# Patient Record
Sex: Female | Born: 1950 | Race: White | Hispanic: No | Marital: Single
Health system: Southern US, Community
[De-identification: ages and names within clinical notes are randomized; demographics above are authoritative.]

---

## 2005-07-08 ENCOUNTER — Encounter: Admission: RE | Admit: 2005-07-08 | Discharge: 2005-07-08 | Payer: Self-pay | Admitting: Internal Medicine

## 2008-02-24 ENCOUNTER — Encounter: Admission: RE | Admit: 2008-02-24 | Discharge: 2008-02-24 | Payer: Self-pay | Admitting: Internal Medicine

## 2009-02-26 ENCOUNTER — Encounter: Admission: RE | Admit: 2009-02-26 | Discharge: 2009-02-26 | Payer: Self-pay | Admitting: Internal Medicine

## 2010-06-25 ENCOUNTER — Ambulatory Visit (HOSPITAL_COMMUNITY): Payer: Self-pay

## 2010-06-27 ENCOUNTER — Ambulatory Visit (HOSPITAL_COMMUNITY): Payer: Self-pay

## 2010-08-05 ENCOUNTER — Other Ambulatory Visit: Payer: Self-pay | Admitting: Surgery

## 2010-08-05 DIAGNOSIS — Z1231 Encounter for screening mammogram for malignant neoplasm of breast: Secondary | ICD-10-CM

## 2010-08-13 ENCOUNTER — Ambulatory Visit
Admission: RE | Admit: 2010-08-13 | Discharge: 2010-08-13 | Disposition: A | Discharge status: Home / Self Care | Payer: BC Managed Care – PPO | Source: Ambulatory Visit | Attending: Surgery | Admitting: Surgery

## 2010-08-13 DIAGNOSIS — Z1231 Encounter for screening mammogram for malignant neoplasm of breast: Secondary | ICD-10-CM

## 2011-10-13 ENCOUNTER — Other Ambulatory Visit: Payer: Self-pay | Admitting: Internal Medicine

## 2011-10-13 DIAGNOSIS — Z1231 Encounter for screening mammogram for malignant neoplasm of breast: Secondary | ICD-10-CM

## 2011-10-30 ENCOUNTER — Ambulatory Visit
Admission: RE | Admit: 2011-10-30 | Discharge: 2011-10-30 | Disposition: A | Discharge status: Home / Self Care | Payer: BC Managed Care – PPO | Source: Ambulatory Visit | Attending: Internal Medicine | Admitting: Internal Medicine

## 2011-10-30 DIAGNOSIS — Z1231 Encounter for screening mammogram for malignant neoplasm of breast: Secondary | ICD-10-CM

## 2013-02-16 ENCOUNTER — Other Ambulatory Visit: Payer: Self-pay

## 2013-02-16 DIAGNOSIS — Z1231 Encounter for screening mammogram for malignant neoplasm of breast: Secondary | ICD-10-CM

## 2013-03-17 ENCOUNTER — Ambulatory Visit
Admission: RE | Admit: 2013-03-17 | Discharge: 2013-03-17 | Disposition: A | Discharge status: Home / Self Care | Payer: Medicare (Managed Care) | Source: Ambulatory Visit

## 2013-03-17 DIAGNOSIS — Z1231 Encounter for screening mammogram for malignant neoplasm of breast: Secondary | ICD-10-CM

## 2013-09-15 ENCOUNTER — Encounter: Payer: Self-pay | Admitting: Cardiology

## 2013-09-16 ENCOUNTER — Encounter: Payer: Self-pay | Admitting: Cardiology

## 2013-10-06 ENCOUNTER — Telehealth: Payer: Self-pay | Admitting: *Deleted

## 2013-10-06 NOTE — Telephone Encounter (Signed)
Left message for pt to call back to discuss scheduling of right and left heart cath for cardiomyopathy per her PCP Dr August Saucer discussing with Dr Antoine Poche.

## 2013-10-06 NOTE — Telephone Encounter (Signed)
Follow up      Pt returning your call. Pt is going to have labs done and will be back home around 3 pm please call her cell if need be.

## 2013-10-07 ENCOUNTER — Encounter (HOSPITAL_COMMUNITY): Payer: Self-pay

## 2013-10-07 ENCOUNTER — Encounter: Payer: Self-pay | Admitting: *Deleted

## 2013-10-07 NOTE — Telephone Encounter (Signed)
Pt has been scheduled for a cardiac cath on Tuesday June 2.  Dr Antoine Poche aware. I reviewed all instructions with Natalie Richmond on the phone and emailed them to Natalie Richmond as well.  Per Dr Antoine Poche she is to remain off coumadin until after Natalie Richmond cath.  She is aware and will call back if any questions or concerns prior to the procedure.

## 2013-10-07 NOTE — Telephone Encounter (Signed)
Follow up        Pt is returning nurses call

## 2013-10-11 ENCOUNTER — Telehealth: Payer: Self-pay | Admitting: Cardiology

## 2013-10-11 ENCOUNTER — Ambulatory Visit (HOSPITAL_COMMUNITY)
Admission: RE | Admit: 2013-10-11 | Payer: Medicare (Managed Care) | Source: Ambulatory Visit | Admitting: Cardiovascular Disease

## 2013-10-11 ENCOUNTER — Encounter (HOSPITAL_COMMUNITY): Admission: RE | Payer: Self-pay | Source: Ambulatory Visit

## 2013-10-11 SURGERY — LEFT AND RIGHT HEART CATHETERIZATION WITH CORONARY ANGIOGRAM
Anesthesia: LOCAL

## 2013-10-11 NOTE — Telephone Encounter (Signed)
LM on pt cell VM and HP.  Need pt's insurance info to check for prior auth requirement for heart cath.

## 2013-10-14 ENCOUNTER — Telehealth: Payer: Self-pay | Admitting: *Deleted

## 2013-10-14 NOTE — Telephone Encounter (Signed)
Pt needs to be re-scheduled for her cardiac cath that was cancelled because she had not been pre-certed.  According to documentation from [facility] pt does not need prior auth.  Left message for pt to call back to discuss rescheduling.

## 2013-10-19 NOTE — Telephone Encounter (Signed)
Left another message for pt to call back to discuss rescheduling cardiac cath.

## 2013-10-21 NOTE — Telephone Encounter (Signed)
Pt had cath at [facility]

## 2014-08-18 ENCOUNTER — Other Ambulatory Visit: Payer: Self-pay

## 2014-08-18 DIAGNOSIS — Z1231 Encounter for screening mammogram for malignant neoplasm of breast: Secondary | ICD-10-CM

## 2014-08-29 ENCOUNTER — Ambulatory Visit
Admission: RE | Admit: 2014-08-29 | Discharge: 2014-08-29 | Disposition: A | Discharge status: Home / Self Care | Payer: Medicare Other | Source: Ambulatory Visit

## 2014-08-29 DIAGNOSIS — Z1231 Encounter for screening mammogram for malignant neoplasm of breast: Secondary | ICD-10-CM

## 2015-10-30 ENCOUNTER — Other Ambulatory Visit: Payer: Self-pay | Admitting: Internal Medicine

## 2015-10-30 DIAGNOSIS — Z1231 Encounter for screening mammogram for malignant neoplasm of breast: Secondary | ICD-10-CM

## 2015-11-15 ENCOUNTER — Ambulatory Visit
Admission: RE | Admit: 2015-11-15 | Discharge: 2015-11-15 | Disposition: A | Discharge status: Home / Self Care | Payer: Medicare Other | Source: Ambulatory Visit | Attending: Internal Medicine | Admitting: Internal Medicine

## 2015-11-15 DIAGNOSIS — Z1231 Encounter for screening mammogram for malignant neoplasm of breast: Secondary | ICD-10-CM

## 2016-10-30 ENCOUNTER — Other Ambulatory Visit: Payer: Self-pay | Admitting: Internal Medicine

## 2016-10-30 DIAGNOSIS — Z1231 Encounter for screening mammogram for malignant neoplasm of breast: Secondary | ICD-10-CM

## 2016-11-17 ENCOUNTER — Ambulatory Visit
Admission: RE | Admit: 2016-11-17 | Discharge: 2016-11-17 | Disposition: A | Discharge status: Home / Self Care | Payer: Medicare HMO | Source: Ambulatory Visit | Attending: Internal Medicine | Admitting: Internal Medicine

## 2016-11-17 DIAGNOSIS — Z1231 Encounter for screening mammogram for malignant neoplasm of breast: Secondary | ICD-10-CM

## 2018-05-17 ENCOUNTER — Other Ambulatory Visit: Payer: Self-pay | Admitting: Internal Medicine

## 2018-05-17 DIAGNOSIS — Z1231 Encounter for screening mammogram for malignant neoplasm of breast: Secondary | ICD-10-CM

## 2018-06-16 ENCOUNTER — Encounter: Payer: Self-pay | Admitting: Radiology

## 2018-06-16 ENCOUNTER — Ambulatory Visit
Admission: RE | Admit: 2018-06-16 | Discharge: 2018-06-16 | Disposition: A | Discharge status: Home / Self Care | Payer: Medicare HMO | Source: Ambulatory Visit | Attending: Internal Medicine | Admitting: Internal Medicine

## 2018-06-16 DIAGNOSIS — Z1231 Encounter for screening mammogram for malignant neoplasm of breast: Secondary | ICD-10-CM

## 2019-11-22 ENCOUNTER — Other Ambulatory Visit: Payer: Self-pay | Admitting: Internal Medicine

## 2019-11-22 DIAGNOSIS — Z1231 Encounter for screening mammogram for malignant neoplasm of breast: Secondary | ICD-10-CM

## 2019-11-29 ENCOUNTER — Ambulatory Visit: Payer: Medicare HMO

## 2019-12-16 ENCOUNTER — Ambulatory Visit: Payer: Medicare HMO

## 2019-12-30 ENCOUNTER — Other Ambulatory Visit: Payer: Self-pay

## 2019-12-30 ENCOUNTER — Ambulatory Visit
Admission: RE | Admit: 2019-12-30 | Discharge: 2019-12-30 | Disposition: A | Discharge status: Home / Self Care | Payer: Medicare HMO | Source: Ambulatory Visit | Attending: *Deleted | Admitting: *Deleted

## 2019-12-30 DIAGNOSIS — Z1231 Encounter for screening mammogram for malignant neoplasm of breast: Secondary | ICD-10-CM

## 2021-01-16 ENCOUNTER — Other Ambulatory Visit: Payer: Self-pay | Admitting: Internal Medicine

## 2021-01-16 DIAGNOSIS — Z1231 Encounter for screening mammogram for malignant neoplasm of breast: Secondary | ICD-10-CM

## 2021-01-17 ENCOUNTER — Ambulatory Visit
Admission: RE | Admit: 2021-01-17 | Discharge: 2021-01-17 | Disposition: A | Discharge status: Home / Self Care | Payer: Medicare HMO | Source: Ambulatory Visit | Attending: Internal Medicine | Admitting: Internal Medicine

## 2021-01-17 ENCOUNTER — Other Ambulatory Visit: Payer: Self-pay

## 2021-01-17 DIAGNOSIS — Z1231 Encounter for screening mammogram for malignant neoplasm of breast: Secondary | ICD-10-CM

## 2022-01-19 IMAGING — MG MM DIGITAL SCREENING BILAT W/ TOMO AND CAD
8 series · 8 of 24 positions shown · non-contrast
Comparison: Previous exam(s).

CLINICAL DATA: Screening.

EXAM:
DIGITAL SCREENING BILATERAL MAMMOGRAM WITH TOMOSYNTHESIS AND CAD
TECHNIQUE: Bilateral screening digital craniocaudal and mediolateral oblique
mammograms were obtained. Bilateral screening digital breast
tomosynthesis was performed. The images were evaluated with
computer-aided detection.

[R CC synth-2D]
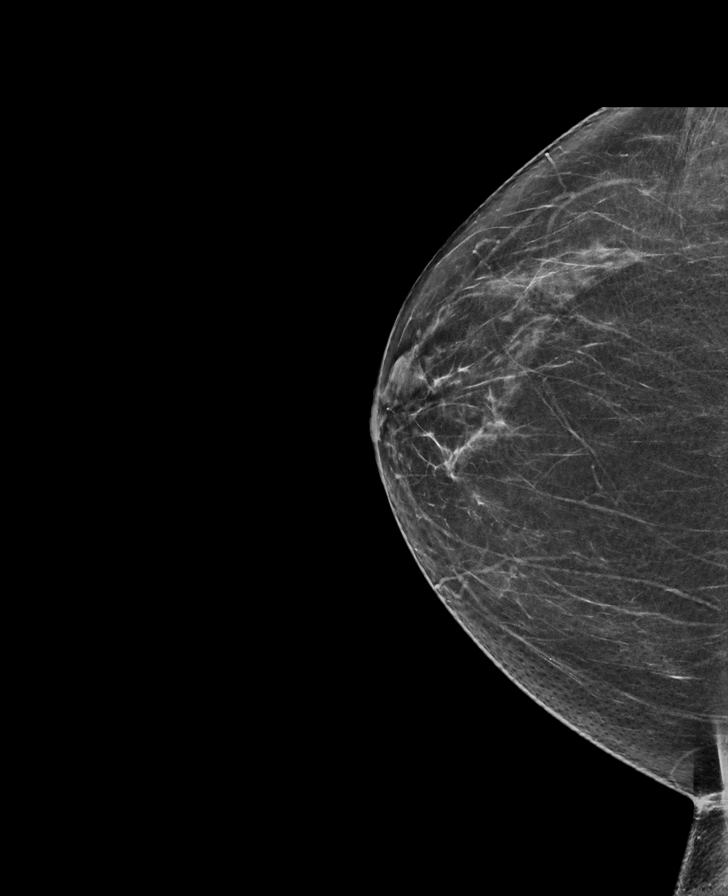

[L MLO synth-2D]
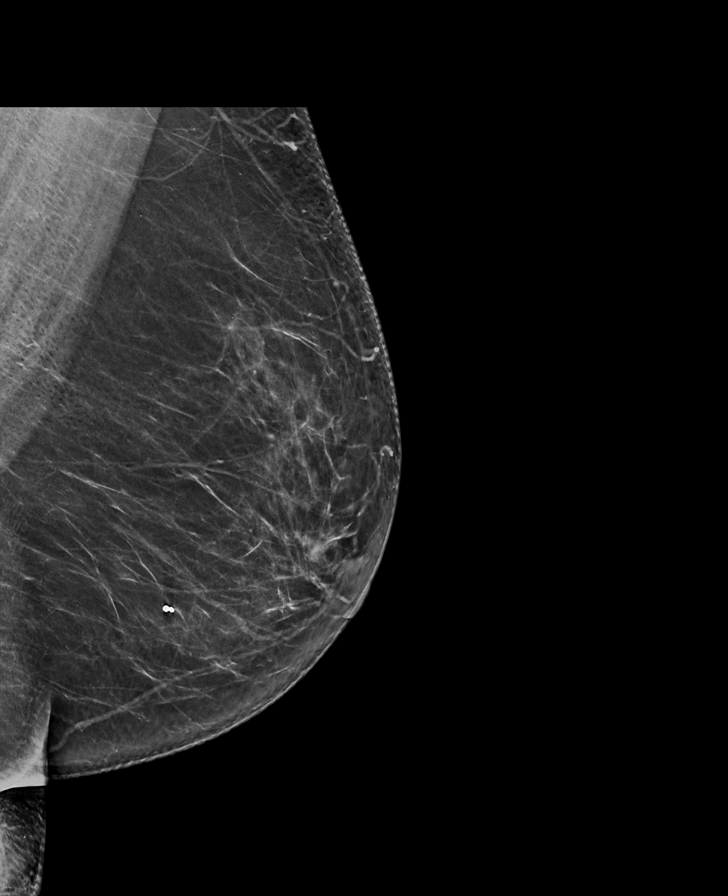

[L CC synth-2D]
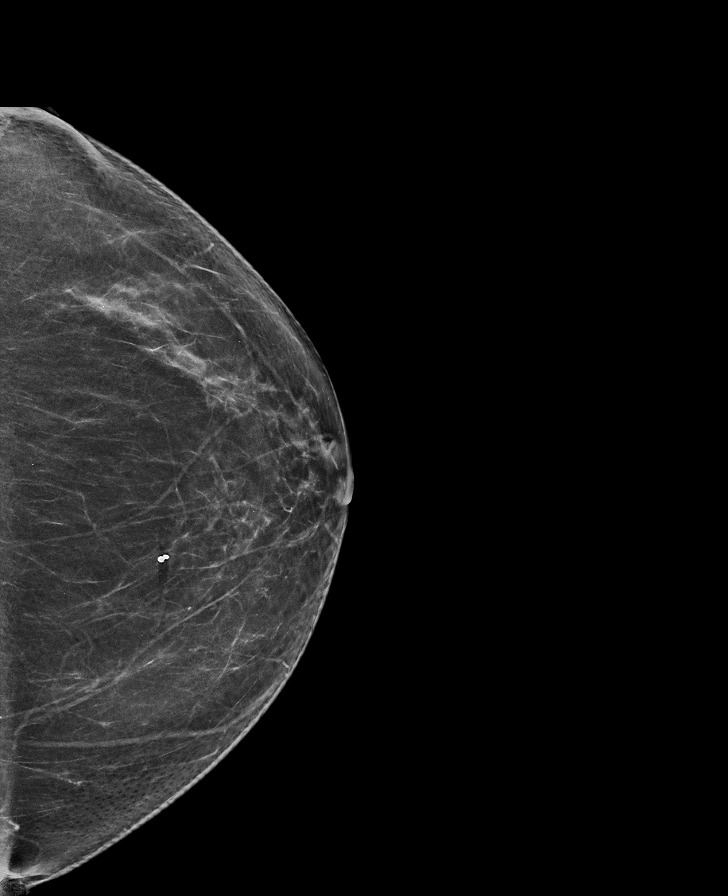

[R MLO synth-2D]
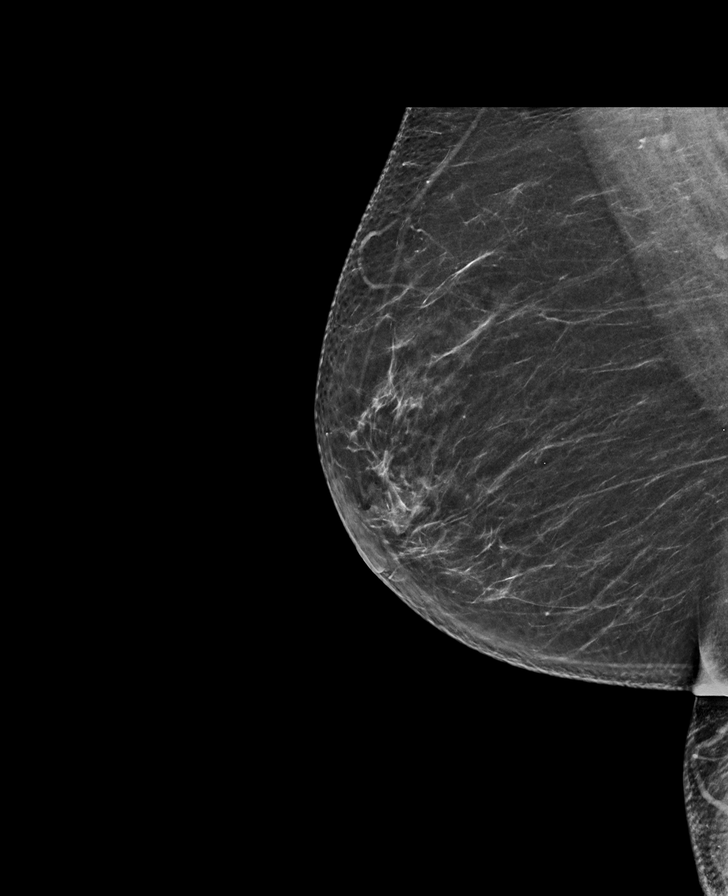

[R MLO tomo · tomo slice 36/71.0]
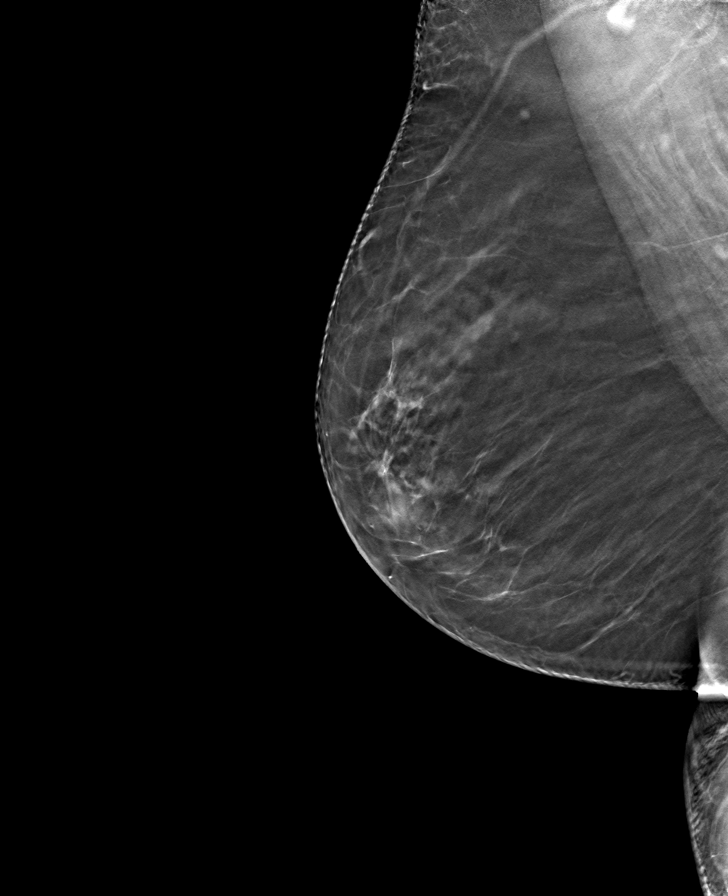

[L CC tomo · tomo slice 37/72.0]
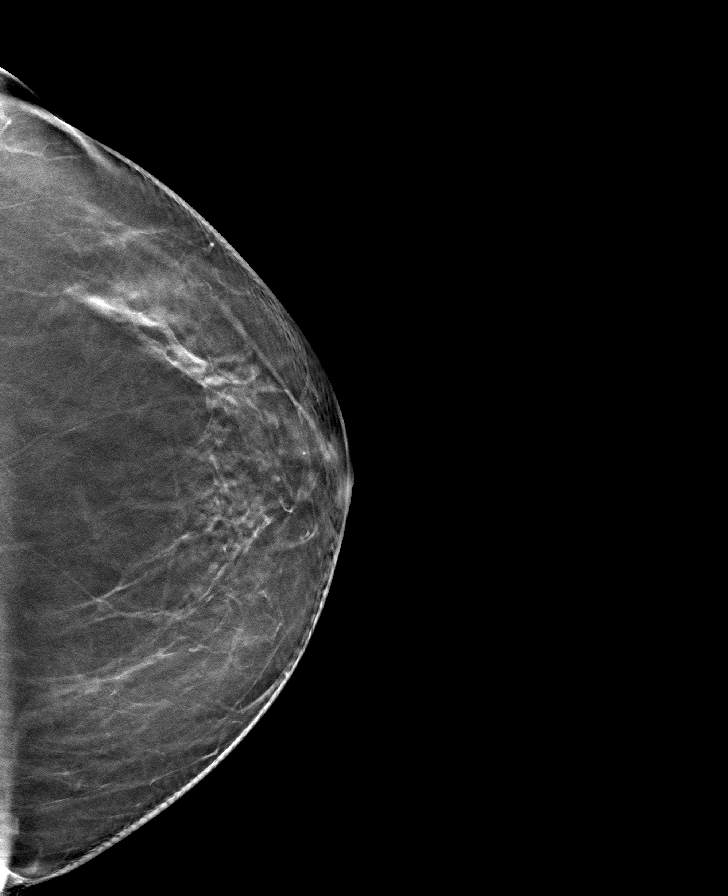

[R CC tomo · tomo slice 35/68.0]
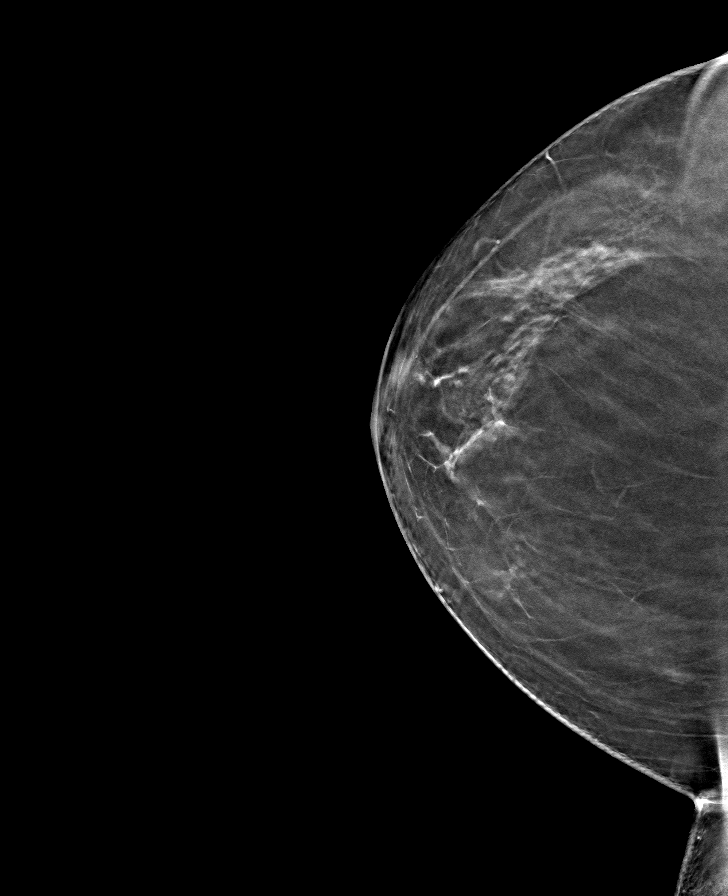

[L MLO tomo · tomo slice 38/75.0]
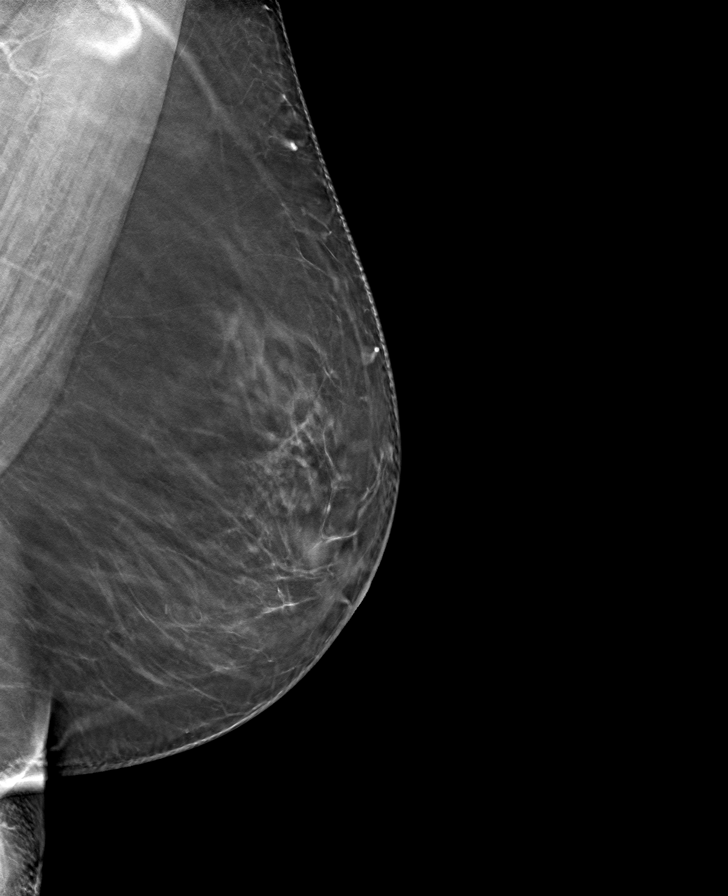

[8 of 24 positions shown; findings below may reference images not displayed]

ACR Breast Density Category b: There are scattered areas of
fibroglandular density.
FINDINGS: There are no findings suspicious for malignancy.
IMPRESSION: No mammographic evidence of malignancy. A result letter of this
screening mammogram will be mailed directly to the patient.

RECOMMENDATION:
Screening mammogram in one year. (Code:51-O-LD2)

BI-RADS CATEGORY  1: Negative.

## 2022-02-28 ENCOUNTER — Other Ambulatory Visit: Payer: Self-pay | Admitting: Internal Medicine

## 2022-02-28 DIAGNOSIS — Z1231 Encounter for screening mammogram for malignant neoplasm of breast: Secondary | ICD-10-CM

## 2022-04-18 ENCOUNTER — Ambulatory Visit
Admission: RE | Admit: 2022-04-18 | Discharge: 2022-04-18 | Disposition: A | Discharge status: Home / Self Care | Payer: Medicare HMO | Source: Ambulatory Visit | Attending: *Deleted | Admitting: *Deleted

## 2022-04-18 DIAGNOSIS — Z1231 Encounter for screening mammogram for malignant neoplasm of breast: Secondary | ICD-10-CM

## 2023-03-24 ENCOUNTER — Other Ambulatory Visit: Payer: Self-pay | Admitting: Family Medicine

## 2023-03-24 DIAGNOSIS — Z1231 Encounter for screening mammogram for malignant neoplasm of breast: Secondary | ICD-10-CM

## 2023-04-23 ENCOUNTER — Ambulatory Visit
Admission: RE | Admit: 2023-04-23 | Discharge: 2023-04-23 | Disposition: A | Discharge status: Home / Self Care | Payer: Medicare HMO | Source: Ambulatory Visit

## 2023-04-23 DIAGNOSIS — Z1231 Encounter for screening mammogram for malignant neoplasm of breast: Secondary | ICD-10-CM
